# Patient Record
Sex: Female | Born: 1995 | Race: White | Hispanic: No | Smoking: Never smoker
Health system: Southern US, Community
[De-identification: ages and names within clinical notes are randomized; demographics above are authoritative.]

---

## 2013-07-29 ENCOUNTER — Emergency Department: Payer: Self-pay | Admitting: Emergency Medicine

## 2013-07-29 LAB — RAPID INFLUENZA A&B ANTIGENS

## 2015-04-03 ENCOUNTER — Emergency Department: Payer: Managed Care, Other (non HMO)

## 2015-04-03 ENCOUNTER — Emergency Department
Admission: EM | Admit: 2015-04-03 | Discharge: 2015-04-03 | Payer: Managed Care, Other (non HMO) | Attending: Emergency Medicine | Admitting: Emergency Medicine

## 2015-04-03 ENCOUNTER — Encounter: Payer: Self-pay | Admitting: Emergency Medicine

## 2015-04-03 DIAGNOSIS — R000 Tachycardia, unspecified: Secondary | ICD-10-CM | POA: Insufficient documentation

## 2015-04-03 DIAGNOSIS — Y92009 Unspecified place in unspecified non-institutional (private) residence as the place of occurrence of the external cause: Secondary | ICD-10-CM | POA: Diagnosis not present

## 2015-04-03 DIAGNOSIS — Y998 Other external cause status: Secondary | ICD-10-CM | POA: Insufficient documentation

## 2015-04-03 DIAGNOSIS — F10120 Alcohol abuse with intoxication, uncomplicated: Secondary | ICD-10-CM | POA: Insufficient documentation

## 2015-04-03 DIAGNOSIS — Y9389 Activity, other specified: Secondary | ICD-10-CM | POA: Insufficient documentation

## 2015-04-03 DIAGNOSIS — W19XXXA Unspecified fall, initial encounter: Secondary | ICD-10-CM

## 2015-04-03 DIAGNOSIS — S0993XA Unspecified injury of face, initial encounter: Secondary | ICD-10-CM

## 2015-04-03 DIAGNOSIS — F419 Anxiety disorder, unspecified: Secondary | ICD-10-CM | POA: Diagnosis not present

## 2015-04-03 DIAGNOSIS — S032XXA Dislocation of tooth, initial encounter: Secondary | ICD-10-CM | POA: Insufficient documentation

## 2015-04-03 DIAGNOSIS — F10920 Alcohol use, unspecified with intoxication, uncomplicated: Secondary | ICD-10-CM

## 2015-04-03 DIAGNOSIS — F10129 Alcohol abuse with intoxication, unspecified: Secondary | ICD-10-CM | POA: Diagnosis present

## 2015-04-03 DIAGNOSIS — W1839XA Other fall on same level, initial encounter: Secondary | ICD-10-CM | POA: Diagnosis not present

## 2015-04-03 LAB — CBC WITH DIFFERENTIAL/PLATELET
BASOS ABS: 0 10*3/uL (ref 0–0.1)
BASOS PCT: 0 %
Eosinophils Absolute: 0 10*3/uL (ref 0–0.7)
Eosinophils Relative: 0 %
HEMATOCRIT: 39.3 % (ref 35.0–47.0)
Hemoglobin: 13.8 g/dL (ref 12.0–16.0)
LYMPHS PCT: 43 %
Lymphs Abs: 3 10*3/uL (ref 1.0–3.6)
MCH: 33.2 pg (ref 26.0–34.0)
MCHC: 35.2 g/dL (ref 32.0–36.0)
MCV: 94.2 fL (ref 80.0–100.0)
Monocytes Absolute: 0.4 10*3/uL (ref 0.2–0.9)
Monocytes Relative: 6 %
NEUTROS ABS: 3.6 10*3/uL (ref 1.4–6.5)
Neutrophils Relative %: 51 %
Platelets: 325 10*3/uL (ref 150–440)
RBC: 4.17 MIL/uL (ref 3.80–5.20)
RDW: 12.9 % (ref 11.5–14.5)
WBC: 7 10*3/uL (ref 3.6–11.0)

## 2015-04-03 LAB — BASIC METABOLIC PANEL
ANION GAP: 12 (ref 5–15)
BUN: 10 mg/dL (ref 6–20)
CO2: 23 mmol/L (ref 22–32)
Calcium: 7.6 mg/dL — ABNORMAL LOW (ref 8.9–10.3)
Chloride: 100 mmol/L — ABNORMAL LOW (ref 101–111)
Creatinine, Ser: 0.58 mg/dL (ref 0.44–1.00)
Glucose, Bld: 113 mg/dL — ABNORMAL HIGH (ref 65–99)
POTASSIUM: 4.6 mmol/L (ref 3.5–5.1)
Sodium: 135 mmol/L (ref 135–145)

## 2015-04-03 LAB — ETHANOL: Alcohol, Ethyl (B): 352 mg/dL (ref <5)

## 2015-04-03 MED ORDER — SODIUM CHLORIDE 0.9 % IV BOLUS (SEPSIS)
1000.0000 mL | Freq: Once | INTRAVENOUS | Status: AC
  Administered 2015-04-03: 1000 mL via INTRAVENOUS

## 2015-04-03 NOTE — ED Notes (Signed)
Lab called critical ETOH result of 352 to this RN. Result communicated to primary RN and attending MD.

## 2015-04-03 NOTE — ED Notes (Signed)
Patient ambulatory to triage with steady gait, without difficulty or distress noted; +ETOH, pt reports falling on concrete, front teeth missing; denies LOC/HA/dizziness
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
